# Patient Record
Sex: Male | Born: 1995 | Race: Black or African American | Hispanic: No | Marital: Single | Smoking: Never smoker
Health system: Southern US, Community
[De-identification: ages and names within clinical notes are randomized; demographics above are authoritative.]

## PROBLEM LIST (undated history)

## (undated) DIAGNOSIS — I319 Disease of pericardium, unspecified: Secondary | ICD-10-CM

## (undated) DIAGNOSIS — R011 Cardiac murmur, unspecified: Secondary | ICD-10-CM

## (undated) DIAGNOSIS — J40 Bronchitis, not specified as acute or chronic: Secondary | ICD-10-CM

---
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
## 2015-03-31 ENCOUNTER — Emergency Department (INDEPENDENT_AMBULATORY_CARE_PROVIDER_SITE_OTHER)
Admission: EM | Admit: 2015-03-31 | Discharge: 2015-03-31 | Disposition: A | Discharge status: Home / Self Care | Payer: Federal, State, Local not specified - PPO | Source: Home / Self Care | Attending: Family Medicine | Admitting: Family Medicine

## 2015-03-31 ENCOUNTER — Encounter (HOSPITAL_COMMUNITY): Payer: Self-pay | Admitting: Emergency Medicine

## 2015-03-31 DIAGNOSIS — J301 Allergic rhinitis due to pollen: Secondary | ICD-10-CM | POA: Diagnosis not present

## 2015-03-31 MED ORDER — TRIAMCINOLONE ACETONIDE 40 MG/ML IJ SUSP
40.0000 mg | Freq: Once | INTRAMUSCULAR | Status: AC
  Administered 2015-03-31: 40 mg via INTRAMUSCULAR

## 2015-03-31 MED ORDER — TRIAMCINOLONE ACETONIDE 40 MG/ML IJ SUSP
INTRAMUSCULAR | Status: AC
  Filled 2015-03-31: qty 1

## 2015-03-31 NOTE — ED Notes (Signed)
C/o allergies since early march.  Sx include sneezing, itchy eyes  Reports he was seen here last year and rec'd an IM inj that helped Alert, no signs of acute distress.

## 2015-03-31 NOTE — Discharge Instructions (Signed)
Allergic Rhinitis Allergic rhinitis is when the mucous membranes in the nose respond to allergens. Allergens are particles in the air that cause your body to have an allergic reaction. This causes you to release allergic antibodies. Through a chain of events, these eventually cause you to release histamine into the blood stream. Although meant to protect the body, it is this release of histamine that causes your discomfort, such as frequent sneezing, congestion, and an itchy, runny nose.  CAUSES  Seasonal allergic rhinitis (hay fever) is caused by pollen allergens that may come from grasses, trees, and weeds. Year-round allergic rhinitis (perennial allergic rhinitis) is caused by allergens such as house dust mites, pet dander, and mold spores.  SYMPTOMS   Nasal stuffiness (congestion).  Itchy, runny nose with sneezing and tearing of the eyes. DIAGNOSIS  Your health care provider can help you determine the allergen or allergens that trigger your symptoms. If you and your health care provider are unable to determine the allergen, skin or blood testing may be used. TREATMENT  Allergic rhinitis does not have a cure, but it can be controlled by:   May different medications are available over the counter. It is best to continue taking these to keep symptoms at bay. Many of these medicines are the same prescription medications prescribed just 2 or more years ago and available over the counter.  Medicines and allergy shots (immunotherapy).  Avoiding the allergen. Hay fever may often be treated with antihistamines in pill or nasal spray forms. Antihistamines block the effects of histamine. There are over-the-counter medicines that may help with nasal congestion and swelling around the eyes. Check with your health care provider before taking or giving this medicine.  If avoiding the allergen or the medicine prescribed do not work, there are many new medicines your health care provider can prescribe. Stronger  medicine may be used if initial measures are ineffective. Desensitizing injections can be used if medicine and avoidance does not work. Desensitization is when a patient is given ongoing shots until the body becomes less sensitive to the allergen. Make sure you follow up with your health care provider if problems continue. HOME CARE INSTRUCTIONS It is not possible to completely avoid allergens, but you can reduce your symptoms by taking steps to limit your exposure to them. It helps to know exactly what you are allergic to so that you can avoid your specific triggers. SEEK MEDICAL CARE IF:   You have a fever.  You develop a cough that does not stop easily (persistent).  You have shortness of breath.  You start wheezing.  Symptoms interfere with normal daily activities. Document Released: 07/30/2001 Document Revised: 11/09/2013 Document Reviewed: 07/12/2013 [facility] Patient Information 2015 [place], [place]. This information is not intended to replace advice given to you by your health care provider. Make sure you discuss any questions you have with your health care provider.

## 2015-03-31 NOTE — ED Provider Notes (Signed)
CSN: [id number]     Arrival date & time 03/31/15  1519 History   First MD Initiated Contact with Patient 03/31/15 1548     Chief Complaint  Patient presents with  . Allergies   (Consider location/radiation/quality/duration/timing/severity/associated sxs/prior Treatment) HPI Comments: 19 year old male complaining of allergic rhinitis symptoms since policies and started in March of this year. Complains of PND, sneezing, watery eyes, occasional cough and runny nose. He states that most medicines over-the-counter do not really help. He wants an injection of Kenalog.   Past Medical History  Diagnosis Date  . Heart murmur   . Bronchitis    History reviewed. No pertinent past surgical history. Family History  Problem Relation Age of Onset  . Sudden death Neg Hx   . Heart attack Neg Hx    History  Substance Use Topics  . Smoking status: Never Smoker   . Smokeless tobacco: Not on file  . Alcohol Use: No    Review of Systems  Constitutional: Negative for fever, activity change and fatigue.  HENT: Negative for sore throat.        As per history of present illness  Eyes: Positive for itching.  Respiratory: Negative for shortness of breath.   Cardiovascular: Negative.   All other systems reviewed and are negative.   Allergies  Dust mite extract  Home Medications   Prior to Admission medications   Medication Sig Start Date End Date Taking? Authorizing Provider  cetirizine (ZYRTEC) 10 MG tablet Take 10 mg by mouth daily.    Historical Provider, MD  cetirizine (ZYRTEC) 10 MG tablet Take 1 tablet (10 mg total) by mouth daily. One tab daily for allergies 03/22/14   Linna HoffJames D Kindl, MD  chlorpheniramine-HYDROcodone [facility](TUSSIONEX PENNKINETIC ER) 10-8 MG/5ML [facility] Take 5 mLs by mouth every 12 (twelve) hours as needed for cough. 03/22/14   Linna HoffJames D Kindl, MD  diclofenac sodium (VOLTAREN) 1 % GEL Apply topically. As needed bilateral knees    Historical Provider, MD  doxycycline (VIBRAMYCIN) 100 MG  capsule Take 1 capsule (100 mg total) by mouth 2 (two) times daily. One po bid x 7 days 08/11/13   Geoffery Lyonsouglas Delo, MD  fluticasone [facility](FLONASE) 50 MCG/ACT nasal spray Place 1 spray into both nostrils 2 (two) times daily. 03/22/14   Linna HoffJames D Kindl, MD  Guaifenesin 1200 MG TB12 Take 1 tablet (1,200 mg total) by mouth 2 (two) times daily. 01/24/13   Charlestine Nighthristopher Lawyer, PA-C  ibuprofen (ADVIL,MOTRIN) 800 MG tablet Take 1 tablet (800 mg total) by mouth every 8 (eight) hours as needed for pain. 01/24/13   Christopher Lawyer, PA-C   BP 136/73 mmHg  Pulse 75  Temp(Src) 98.2 F (36.8 C) (Oral)  Resp 16  SpO2 98% Physical Exam  Constitutional: He is oriented to person, place, and time. He appears well-developed and well-nourished. No distress.  HENT:  Mouth/Throat: Oropharynx is clear and moist. No oropharyngeal exudate.  Bilateral TMs are normal  Eyes: Conjunctivae and EOM are normal.  Neck: Normal range of motion. Neck supple.  Cardiovascular: Normal rate, regular rhythm and normal heart sounds.   Pulmonary/Chest: Effort normal and breath sounds normal. No respiratory distress.  Musculoskeletal: He exhibits no edema.  Lymphadenopathy:    He has no cervical adenopathy.  Neurological: He is alert and oriented to person, place, and time. He exhibits normal muscle tone.  Skin: Skin is warm and dry. No rash noted.  Nursing note and vitals reviewed.   ED Course  Procedures (including critical care time) Labs  Review Labs Reviewed - No data to display  Imaging Review No results found.   MDM   1. Allergic rhinitis due to pollen    Kenalog 40 mg IM now OTC meds are available, instructions provided.    Hayden Rasmussenavid Sameeha Rockefeller, NP 03/31/15 [phone]

## 2015-05-21 ENCOUNTER — Encounter (HOSPITAL_BASED_OUTPATIENT_CLINIC_OR_DEPARTMENT_OTHER): Payer: Self-pay | Admitting: *Deleted

## 2015-05-21 ENCOUNTER — Emergency Department (HOSPITAL_BASED_OUTPATIENT_CLINIC_OR_DEPARTMENT_OTHER)
Admission: EM | Admit: 2015-05-21 | Discharge: 2015-05-21 | Disposition: A | Discharge status: Home / Self Care | Payer: Federal, State, Local not specified - PPO | Attending: Emergency Medicine | Admitting: Emergency Medicine

## 2015-05-21 DIAGNOSIS — Z7951 Long term (current) use of inhaled steroids: Secondary | ICD-10-CM | POA: Diagnosis not present

## 2015-05-21 DIAGNOSIS — S50861A Insect bite (nonvenomous) of right forearm, initial encounter: Secondary | ICD-10-CM | POA: Diagnosis present

## 2015-05-21 DIAGNOSIS — W57XXXA Bitten or stung by nonvenomous insect and other nonvenomous arthropods, initial encounter: Secondary | ICD-10-CM | POA: Insufficient documentation

## 2015-05-21 DIAGNOSIS — Y998 Other external cause status: Secondary | ICD-10-CM | POA: Diagnosis not present

## 2015-05-21 DIAGNOSIS — Y9389 Activity, other specified: Secondary | ICD-10-CM | POA: Insufficient documentation

## 2015-05-21 DIAGNOSIS — Z8709 Personal history of other diseases of the respiratory system: Secondary | ICD-10-CM | POA: Diagnosis not present

## 2015-05-21 DIAGNOSIS — Y9289 Other specified places as the place of occurrence of the external cause: Secondary | ICD-10-CM | POA: Diagnosis not present

## 2015-05-21 DIAGNOSIS — R011 Cardiac murmur, unspecified: Secondary | ICD-10-CM | POA: Diagnosis not present

## 2015-05-21 DIAGNOSIS — Z79899 Other long term (current) drug therapy: Secondary | ICD-10-CM | POA: Diagnosis not present

## 2015-05-21 NOTE — ED Notes (Addendum)
Here for evaluation of possible maggot exposure. Saw maggots in his shoes. Threw shoes out and washed feet, then poured alcohol all over his toes. Mentions L little toe was hurting, but has resolved. Also mentions some itching bilateral FAs and L ankle and bilateral feet. No rash or hives noted. Some redness. States, "have been scratching". Skin intact. No meds PTA. Scant redness noted to L little toe upon removing socks and shoes.

## 2015-05-21 NOTE — ED Provider Notes (Signed)
CSN: [id number]     Arrival date & time 05/21/15  0119 History   First MD Initiated Contact with Patient 05/21/15 0150     Chief Complaint  Patient presents with  . Insect Bite     (Consider location/radiation/quality/duration/timing/severity/associated sxs/prior Treatment) Patient is a 19 y.o. male presenting with rash. The history is provided by the patient.  Rash Location:  Shoulder/arm Shoulder/arm rash location:  R forearm Quality: not painful, not red and not swelling   Severity:  Mild Onset quality:  Sudden Timing:  Constant Progression:  Resolved Chronicity:  New Context: not animal contact   Relieved by:  Nothing Worsened by:  Nothing tried Ineffective treatments:  None tried Associated symptoms: no abdominal pain, no fever and no hoarse voice   Saw maggots in his shoes today and had a lesion on forearm and thought it was related but lesion is now gone.    Past Medical History  Diagnosis Date  . Heart murmur   . Bronchitis    History reviewed. No pertinent past surgical history. Family History  Problem Relation Age of Onset  . Sudden death Neg Hx   . Heart attack Neg Hx    History  Substance Use Topics  . Smoking status: Never Smoker   . Smokeless tobacco: Not on file  . Alcohol Use: No    Review of Systems  Constitutional: Negative for fever.  HENT: Negative for drooling, ear discharge and hoarse voice.   Gastrointestinal: Negative for abdominal pain.  Skin: Positive for rash.  All other systems reviewed and are negative.     Allergies  Dust mite extract  Home Medications   Prior to Admission medications   Medication Sig Start Date End Date Taking? Authorizing Provider  cetirizine (ZYRTEC) 10 MG tablet Take 10 mg by mouth daily.    Historical Provider, MD  cetirizine (ZYRTEC) 10 MG tablet Take 1 tablet (10 mg total) by mouth daily. One tab daily for allergies 03/22/14   Linna HoffJames D Kindl, MD  chlorpheniramine-HYDROcodone [facility](TUSSIONEX PENNKINETIC ER)  10-8 MG/5ML [facility] Take 5 mLs by mouth every 12 (twelve) hours as needed for cough. 03/22/14   Linna HoffJames D Kindl, MD  diclofenac sodium (VOLTAREN) 1 % GEL Apply topically. As needed bilateral knees    Historical Provider, MD  doxycycline (VIBRAMYCIN) 100 MG capsule Take 1 capsule (100 mg total) by mouth 2 (two) times daily. One po bid x 7 days 08/11/13   Geoffery Lyonsouglas Delo, MD  fluticasone [facility](FLONASE) 50 MCG/ACT nasal spray Place 1 spray into both nostrils 2 (two) times daily. 03/22/14   Linna HoffJames D Kindl, MD  Guaifenesin 1200 MG TB12 Take 1 tablet (1,200 mg total) by mouth 2 (two) times daily. 01/24/13   Charlestine Nighthristopher Lawyer, PA-C  ibuprofen (ADVIL,MOTRIN) 800 MG tablet Take 1 tablet (800 mg total) by mouth every 8 (eight) hours as needed for pain. 01/24/13   Christopher Lawyer, PA-C   BP 136/77 mmHg  Pulse 95  Temp(Src) 98.1 F (36.7 C) (Oral)  Resp 20  Ht 5\' 9"  (1.753 m)  Wt 107 lb (48.535 kg)  BMI 15.79 kg/m2  SpO2 98% Physical Exam  Constitutional: He is oriented to person, place, and time. He appears well-developed and well-nourished. No distress.  HENT:  Head: Normocephalic and atraumatic.  Mouth/Throat: Oropharynx is clear and moist.  Eyes: Conjunctivae are normal. Pupils are equal, round, and reactive to light.  Neck: Normal range of motion. Neck supple.  Cardiovascular: Normal rate, regular rhythm and intact distal pulses.  Pulmonary/Chest: Effort normal and breath sounds normal. He has no wheezes. He has no rales.  Abdominal: Soft. Bowel sounds are normal. There is no tenderness. There is no rebound and no guarding.  Musculoskeletal: Normal range of motion.  Neurological: He is alert and oriented to person, place, and time.  Skin: Skin is warm and dry. No rash noted.  Psychiatric: He has a normal mood and affect.    ED Course  Procedures (including critical care time) Labs Review Labs Reviewed - No data to display  Imaging Review No results found.   EKG Interpretation None      MDM    Final diagnoses:  Insect bite    Have house exterminated.  No lesions on the skin.      Cy Blamer, MD 05/21/15 [phone]

## 2015-07-10 ENCOUNTER — Emergency Department (INDEPENDENT_AMBULATORY_CARE_PROVIDER_SITE_OTHER)
Admission: EM | Admit: 2015-07-10 | Discharge: 2015-07-10 | Disposition: A | Discharge status: Home / Self Care | Payer: Federal, State, Local not specified - PPO | Source: Home / Self Care

## 2015-07-10 ENCOUNTER — Encounter (HOSPITAL_COMMUNITY): Payer: Self-pay | Admitting: Emergency Medicine

## 2015-07-10 DIAGNOSIS — R51 Headache: Secondary | ICD-10-CM | POA: Diagnosis not present

## 2015-07-10 DIAGNOSIS — R519 Headache, unspecified: Secondary | ICD-10-CM

## 2015-07-10 LAB — POCT RAPID STREP A: Streptococcus, Group A Screen (Direct): NEGATIVE

## 2015-07-10 MED ORDER — IBUPROFEN 600 MG PO TABS: 600.0000 mg | ORAL_TABLET | Freq: Four times a day (QID) | ORAL | Status: DC | PRN

## 2015-07-10 NOTE — Discharge Instructions (Signed)
The cause of your symptoms is not immediately clear but may be due to a mild viral infection, strep infection, or from stress and fatigue. Please get plenty of rest and stay well-hydrated with fluids like Gatorade or Powerade. Please use the Motrin 600 for additional headache and sore throat relief. We will call you if your second strep test is positive to get you antibiotics. The emergency room if you get worse.

## 2015-07-10 NOTE — ED Provider Notes (Signed)
CSN: [id number]     Arrival date & time 07/10/15  1957 History   None    Chief Complaint  Patient presents with  . Headache  . Sore Throat   (Consider location/radiation/quality/duration/timing/severity/associated sxs/prior Treatment) HPI  HA and and mild sore throat. Started 9 days ago. Sore throat is improving but HA is persistent vut improving as well. Tylenol and motrin 200 x 2 w/ some improvement. Diarrhea for a couple of days but has resolved. No caffeine use. HA all over. Denies nausea, vomiting, LOC, change in cognition, chest pain, shortness breath, palpations, neck stiffness, fevers, rash, runny nose, cough, congestion. Patient states that he works at [employer] and was asked to wash the dishes the night prior to onset of symptoms. States that his socks got what was concerned that his symptoms may have come from that.    Past Medical History  Diagnosis Date  . Heart murmur   . Bronchitis    History reviewed. No pertinent past surgical history. Family History  Problem Relation Age of Onset  . Sudden death Neg Hx   . Heart attack Neg Hx    Social History  Substance Use Topics  . Smoking status: Never Smoker   . Smokeless tobacco: None  . Alcohol Use: No    Review of Systems Per HPI with all other pertinent systems negative.   Allergies  Dust mite extract  Home Medications   Prior to Admission medications   Medication Sig Start Date End Date Taking? Authorizing Provider  cetirizine (ZYRTEC) 10 MG tablet Take 10 mg by mouth daily.    Historical Provider, MD  diclofenac sodium (VOLTAREN) 1 % GEL Apply topically. As needed bilateral knees    Historical Provider, MD  fluticasone (FLONASE) 50 MCG/ACT nasal spray Place 1 spray into both nostrils 2 (two) times daily. 03/22/14   Linna Hoff, MD  Guaifenesin 1200 MG TB12 Take 1 tablet (1,200 mg total) by mouth 2 (two) times daily. 01/24/13   Charlestine Night, PA-C  ibuprofen (ADVIL,MOTRIN) 600 MG tablet Take 1 tablet (600 mg  total) by mouth every 6 (six) hours as needed. 07/10/15   Ozella Rocks, MD   BP 130/86 mmHg  Pulse 87  Temp(Src) 97.9 F (36.6 C) (Oral)  Resp 14  SpO2 97% Physical Exam Physical Exam  Constitutional: oriented to person, place, and time. appears well-developed and well-nourished. No distress.  HENT:  Head: Normocephalic and atraumatic.  Eyes: EOMI. PERRL.  Neck: Normal range of motion.  Cardiovascular: RRR, no m/r/g, 2+ distal pulses,  Pulmonary/Chest: Effort normal and breath sounds normal. No respiratory distress.  Abdominal: Soft. Bowel sounds are normal. NonTTP, no distension.  Musculoskeletal: Normal range of motion. Non ttp, no effusion.  Neurological: alert and oriented to person, place, and time.  Skin: Skin is warm. No rash noted. non diaphoretic.  Psychiatric: normal mood and affect. behavior is normal. Judgment and thought content normal.   ED Course  Procedures (including critical care time) Labs Review Labs Reviewed  POCT RAPID STREP A    Imaging Review No results found.   MDM   1. Nonintractable headache    Strep negative. We'll send strep culture. Start Motrin 600, fluids, rest. Go to ED if gets worse.    Ozella Rocks, MD 07/10/15 2113

## 2015-07-10 NOTE — ED Notes (Signed)
Headache, sore throat, cough, nausea and diarrhea started on Saturday 8/13.  Reports diarrhea has stopped, but concerned for the remainder of symptoms.  Denies any vomiting or fever.

## 2015-07-13 LAB — CULTURE, GROUP A STREP: Strep A Culture: NEGATIVE

## 2015-08-22 ENCOUNTER — Encounter (HOSPITAL_COMMUNITY): Payer: Self-pay | Admitting: Emergency Medicine

## 2015-08-22 ENCOUNTER — Emergency Department (INDEPENDENT_AMBULATORY_CARE_PROVIDER_SITE_OTHER)
Admission: EM | Admit: 2015-08-22 | Discharge: 2015-08-22 | Disposition: A | Discharge status: Home / Self Care | Payer: Federal, State, Local not specified - PPO | Source: Home / Self Care | Attending: Family Medicine | Admitting: Family Medicine

## 2015-08-22 DIAGNOSIS — H109 Unspecified conjunctivitis: Secondary | ICD-10-CM | POA: Diagnosis not present

## 2015-08-22 MED ORDER — ERYTHROMYCIN 5 MG/GM OP OINT: TOPICAL_OINTMENT | Freq: Once | OPHTHALMIC | Status: DC

## 2015-08-22 NOTE — ED Provider Notes (Addendum)
CSN: [id number]     Arrival date & time 08/22/15  1311 History   First MD Initiated Contact with Patient 08/22/15 1358     Chief Complaint  Patient presents with  . Eye Problem   (Consider location/radiation/quality/duration/timing/severity/associated sxs/prior Treatment) Patient is a 19 y.o. male presenting with eye problem. The history is provided by the patient. No language interpreter was used.  Eye Problem Location:  L eye Quality:  Tearing Associated symptoms: discharge and itching   Associated symptoms: no photophobia and no redness   Patient presents with 2 day history of itching LEFT eye, discharge from nasal canthus.  No fevers or chills, but has had scratchy throat for the past 2 days. Was in ED accompanying a friend a few days ago, otherwise no sick contacts. 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   Denies photophobia. Feels like prior episode of pink eye.   Past Medical History  Diagnosis Date  . Heart murmur   . Bronchitis    History reviewed. No pertinent past surgical history. Family History  Problem Relation Age of Onset  . Sudden death Neg Hx   . Heart attack Neg Hx    Social History  Substance Use Topics  . Smoking status: Never Smoker   . Smokeless tobacco: None  . Alcohol Use: No    Review of Systems  Constitutional: Negative for fever, chills, diaphoresis, appetite change and fatigue.  HENT: Positive for sore throat. Negative for congestion, ear discharge, ear pain, facial swelling, nosebleeds, postnasal drip, rhinorrhea, sinus pressure and trouble swallowing.   Eyes: Positive for discharge and itching. Negative for photophobia, pain, redness and visual disturbance.  Respiratory: Negative for cough.     Allergies  Dust mite extract  Home Medications   Prior to Admission medications   Medication Sig Start Date End Date Taking? Authorizing Provider  cetirizine (ZYRTEC) 10 MG tablet Take 10 mg by mouth daily.    Historical Provider, MD  diclofenac sodium  (VOLTAREN) 1 % GEL Apply topically. As needed bilateral knees    Historical Provider, MD  fluticasone (FLONASE) 50 MCG/ACT nasal spray Place 1 spray into both nostrils 2 (two) times daily. 03/22/14   Linna Hoff, MD  Guaifenesin 1200 MG TB12 Take 1 tablet (1,200 mg total) by mouth 2 (two) times daily. 01/24/13   Charlestine Night, PA-C  ibuprofen (ADVIL,MOTRIN) 600 MG tablet Take 1 tablet (600 mg total) by mouth every 6 (six) hours as needed. 07/10/15   Ozella Rocks, MD   Meds Ordered and Administered this Visit  Medications - No data to display  BP 121/76 mmHg  Pulse 68  Temp(Src) 97.9 F (36.6 C) (Oral)  Resp 14  SpO2 100% No data found.   Physical Exam  Constitutional: He appears well-developed and well-nourished.  HENT:  Head: Normocephalic and atraumatic.  Right Ear: External ear normal.  Left Ear: External ear normal.  Mouth/Throat: No oropharyngeal exudate.  Mild irritation and cobblestoning of oropharynx, without exudate  Eyes: Conjunctivae and EOM are normal. No scleral icterus.  LEFT eye with non-purulent discharge. PERRL. EOMI. Erythema of conjunctiva of LEFT eye.   Neck: Normal range of motion. Neck supple. No thyromegaly present.  Lymphadenopathy:    He has no cervical adenopathy.    ED Course  Procedures (including critical care time)  Labs Review Labs Reviewed - No data to display  Imaging Review No results found.   Visual Acuity Review  Right Eye Distance:   Left Eye Distance:   Bilateral Distance:  Right Eye Near:   Left Eye Near:    Bilateral Near:         MDM  No diagnosis found.  ADDENDUM TO ORIGINAL NOTE:  Patient returned to [facility] after visit asking for Rx for antibiotic ointment for eye.  He states that pharmacist agrees he needs this. I have explained to him extensively during the visit why this is unnecessary to treat a presumed viral conjunctivitis.  Prescription given to placate patient,I believe it will be relatively innocuous  for him to use x1.   Barbaraann Barthel, MD 08/22/15 1412  Barbaraann Barthel, MD 08/22/15 1416  Barbaraann Barthel, MD 08/22/15 [phone]

## 2015-08-22 NOTE — ED Notes (Signed)
Left eye pink, noticed drainage and pain that started yesterday.  Does not wear contacts.

## 2015-08-22 NOTE — Discharge Instructions (Signed)
It was a pleasure to see you today.  The pink eye of your left eye is most likely viral, and it will improve with time.   Maintain very careful hygiene (hand washing, face washing) and avoidance of spreading to others.  It may spread to your right eye; if so, use warm compresses on both eyes.   Note out of work, may return on Friday, October 7th.   Warm compresses to the left eye frequently throughout the day.

## 2015-10-10 ENCOUNTER — Encounter (HOSPITAL_COMMUNITY): Payer: Self-pay | Admitting: *Deleted

## 2015-10-10 ENCOUNTER — Other Ambulatory Visit (HOSPITAL_COMMUNITY)
Admission: RE | Admit: 2015-10-10 | Discharge: 2015-10-10 | Disposition: A | Discharge status: Home / Self Care | Payer: Federal, State, Local not specified - PPO | Source: Ambulatory Visit | Attending: Emergency Medicine | Admitting: Emergency Medicine

## 2015-10-10 ENCOUNTER — Emergency Department (INDEPENDENT_AMBULATORY_CARE_PROVIDER_SITE_OTHER)
Admission: EM | Admit: 2015-10-10 | Discharge: 2015-10-10 | Disposition: A | Discharge status: Home / Self Care | Payer: Federal, State, Local not specified - PPO | Source: Home / Self Care | Attending: Emergency Medicine | Admitting: Emergency Medicine

## 2015-10-10 DIAGNOSIS — Z113 Encounter for screening for infections with a predominantly sexual mode of transmission: Secondary | ICD-10-CM

## 2015-10-10 DIAGNOSIS — J029 Acute pharyngitis, unspecified: Secondary | ICD-10-CM | POA: Diagnosis not present

## 2015-10-10 NOTE — Discharge Instructions (Signed)
We have sent swabs for testing. We will call you if anything is positive. You can use Chloraseptic spray as needed for your sore throat. Salt water gargles can also be beneficial. Your sore throat is likely from a virus or smoke irritants. Follow-up as needed.

## 2015-10-10 NOTE — ED Notes (Signed)
Pt  Has  An irritated  l  Eye          As   Well      sorethroat   And  He  Wants  To  Be  Checked  For  An  Std     -  He  denys  Any          Penile   Discharge

## 2015-10-10 NOTE — ED Provider Notes (Signed)
CSN: [id number]     Arrival date & time 10/10/15  1957 History   First MD Initiated Contact with Patient 10/10/15 2010     Chief Complaint  Patient presents with  . Eye Problem   (Consider location/radiation/quality/duration/timing/severity/associated sxs/prior Treatment) HPI He is a 19 year old man here for evaluation of sore throat and STD testing. He states he started to feel a little sick one month ago. At that time he was seen here for left eye conjunctivitis. He states the drops given here did not help, but he saw his eye doctor and was prescribed Tobrex which is helping. He describes a sore throat. No nasal congestion or rhinorrhea. No fevers or chills. No nausea or vomiting. He states he has neighbors to smoke marijuana and other substances. He denies smoking anything, but states the smoked as come into his apartment. He would also like STD screening. He denies any symptoms of penile discharge or dysuria. He is anxious as he has just started sexual activity with his girlfriend.  Past Medical History  Diagnosis Date  . Heart murmur   . Bronchitis    History reviewed. No pertinent past surgical history. Family History  Problem Relation Age of Onset  . Sudden death Neg Hx   . Heart attack Neg Hx    Social History  Substance Use Topics  . Smoking status: Never Smoker   . Smokeless tobacco: None  . Alcohol Use: No    Review of Systems As in history of present illness Allergies  Dust mite extract  Home Medications   Prior to Admission medications   Medication Sig Start Date End Date Taking? Authorizing Provider  cetirizine (ZYRTEC) 10 MG tablet Take 10 mg by mouth daily.    Historical Provider, MD  diclofenac sodium (VOLTAREN) 1 % GEL Apply topically. As needed bilateral knees    Historical Provider, MD  erythromycin ophthalmic ointment Place into the left eye once. Place a 1/2 inch ribbon of ointment into the lower eyelid. 08/22/15   Barbaraann BarthelJames O Breen, MD  fluticasone  (FLONASE) 50 MCG/ACT nasal spray Place 1 spray into both nostrils 2 (two) times daily. 03/22/14   Linna HoffJames D Kindl, MD  Guaifenesin 1200 MG TB12 Take 1 tablet (1,200 mg total) by mouth 2 (two) times daily. 01/24/13   Charlestine Nighthristopher Lawyer, PA-C  ibuprofen (ADVIL,MOTRIN) 600 MG tablet Take 1 tablet (600 mg total) by mouth every 6 (six) hours as needed. 07/10/15   Ozella Rocksavid J Merrell, MD   Meds Ordered and Administered this Visit  Medications - No data to display  BP 130/72 mmHg  Pulse 78  Temp(Src) 98.6 F (37 C) (Oral)  Resp 18  SpO2 99% No data found.   Physical Exam  Constitutional: He is oriented to person, place, and time. He appears well-developed and well-nourished. No distress.  Somewhat anxious  HENT:  Mouth/Throat: No oropharyngeal exudate.  Mild pharyngeal erythema  Eyes:  Left conjunctiva is very minimally injected.  Neck: Neck supple.  Cardiovascular: Normal rate.   Pulmonary/Chest: Effort normal.  Lymphadenopathy:    He has no cervical adenopathy.  Neurological: He is alert and oriented to person, place, and time.    ED Course  Procedures (including critical care time)  Labs Review Labs Reviewed  HIV ANTIBODY (ROUTINE TESTING)  RPR  URINE CYTOLOGY ANCILLARY ONLY  CYTOLOGY, (ORAL, ANAL, URETHRAL) ANCILLARY ONLY    Imaging Review No results found.    MDM   1. Pharyngitis   2. Screen for STD (sexually transmitted disease)  Pharyngitis is likely viral versus contact irritant. Recommended symptomatic treatment. STD testing is collected today. I also collected an oral swab for gonorrhea and chlamydia.    Charm Rings, MD 10/10/15 2041

## 2015-10-11 LAB — RPR: RPR Ser Ql: NONREACTIVE

## 2015-10-11 LAB — HIV ANTIBODY (ROUTINE TESTING W REFLEX): HIV Screen 4th Generation wRfx: NONREACTIVE

## 2015-10-12 LAB — CYTOLOGY, (ORAL, ANAL, URETHRAL) ANCILLARY ONLY
CHLAMYDIA, DNA PROBE: POSITIVE — AB
NEISSERIA GONORRHEA: NEGATIVE

## 2015-10-13 LAB — URINE CYTOLOGY ANCILLARY ONLY
CHLAMYDIA, DNA PROBE: POSITIVE — AB
Neisseria Gonorrhea: NEGATIVE
Trichomonas: NEGATIVE

## 2015-10-13 NOTE — ED Notes (Addendum)
Final report of STD testing available for review. Positive for chlamydia. [facility] Called patient, and after verifying ID, discussed lab report findings. [facility] [address]. Spoke directly w pharmacy staff. Form 2124 DHHS [facility]. Patient asked we call the Rx to [facility] [address], [place] . Called and relayed new Rx to pharmacy staff

## 2015-10-15 ENCOUNTER — Emergency Department (INDEPENDENT_AMBULATORY_CARE_PROVIDER_SITE_OTHER)
Admission: EM | Admit: 2015-10-15 | Discharge: 2015-10-15 | Disposition: A | Discharge status: Home / Self Care | Payer: Federal, State, Local not specified - PPO | Source: Home / Self Care | Attending: Family Medicine | Admitting: Family Medicine

## 2015-10-15 ENCOUNTER — Encounter (HOSPITAL_COMMUNITY): Payer: Self-pay | Admitting: *Deleted

## 2015-10-15 DIAGNOSIS — A740 Chlamydial conjunctivitis: Secondary | ICD-10-CM | POA: Diagnosis not present

## 2015-10-15 MED ORDER — ERYTHROMYCIN 5 MG/GM OP OINT: TOPICAL_OINTMENT | Freq: Three times a day (TID) | OPHTHALMIC | Status: AC

## 2015-10-15 NOTE — ED Notes (Signed)
Visual  Acuity  20/   15   r   20/20  Left

## 2015-10-15 NOTE — ED Notes (Signed)
Pt  Reports       l  Eye  Is red  Irritated  And  Some  Blurred  Vision      Pt   Seen  Recently  For  chlymydia       He  Reports  Seeing his  Eye  Dr  Recently

## 2015-10-15 NOTE — ED Provider Notes (Signed)
CSN: [id number]     Arrival date & time 10/15/15  1520 History   First MD Initiated Contact with Patient 10/15/15 1555     Chief Complaint  Patient presents with  . Conjunctivitis   (Consider location/radiation/quality/duration/timing/severity/associated sxs/prior Treatment) Patient is a 19 y.o. male presenting with conjunctivitis. The history is provided by the patient.  Conjunctivitis This is a recurrent problem. The current episode started more than 2 days ago. The problem has been gradually improving (dx'd with chlamydia and given meds but not taking as directed.).    Past Medical History  Diagnosis Date  . Heart murmur   . Bronchitis    History reviewed. No pertinent past surgical history. Family History  Problem Relation Age of Onset  . Sudden death Neg Hx   . Heart attack Neg Hx    Social History  Substance Use Topics  . Smoking status: Never Smoker   . Smokeless tobacco: None  . Alcohol Use: No    Review of Systems  Constitutional: Negative.   HENT: Negative.   Eyes: Positive for redness and visual disturbance. Negative for photophobia and pain.  Respiratory: Negative.   Hematological: Negative for adenopathy.  All other systems reviewed and are negative.   Allergies  Dust mite extract  Home Medications   Prior to Admission medications   Medication Sig Start Date End Date Taking? Authorizing Provider  cetirizine (ZYRTEC) 10 MG tablet Take 10 mg by mouth daily.    Historical Provider, MD  diclofenac sodium (VOLTAREN) 1 % GEL Apply topically. As needed bilateral knees    Historical Provider, MD  erythromycin ophthalmic ointment Place into the left eye 3 (three) times daily. after warm cloth soak to eye. 10/15/15   Linna Hoff, MD  fluticasone (FLONASE) 50 MCG/ACT nasal spray Place 1 spray into both nostrils 2 (two) times daily. 03/22/14   Linna Hoff, MD  Guaifenesin 1200 MG TB12 Take 1 tablet (1,200 mg total) by mouth 2 (two) times daily. 01/24/13    Charlestine Night, PA-C  ibuprofen (ADVIL,MOTRIN) 600 MG tablet Take 1 tablet (600 mg total) by mouth every 6 (six) hours as needed. 07/10/15   Ozella Rocks, MD   Meds Ordered and Administered this Visit  Medications - No data to display  BP 122/79 mmHg  Pulse 77  Temp(Src) 98.4 F (36.9 C) (Oral)  SpO2 100% No data found.   Physical Exam  Constitutional: He is oriented to person, place, and time. He appears well-developed and well-nourished. No distress.  Eyes: EOM and lids are normal. Pupils are equal, round, and reactive to light. Lids are everted and swept, no foreign bodies found. Left eye exhibits no discharge and no exudate. Right conjunctiva is not injected. Right conjunctiva has no hemorrhage. Left conjunctiva is injected. Left conjunctiva has no hemorrhage. No scleral icterus.  Lymphadenopathy:    He has no cervical adenopathy.  Neurological: He is oriented to person, place, and time.  Skin: Skin is warm and dry.  Nursing note and vitals reviewed.   ED Course  Procedures (including critical care time)  Labs Review Labs Reviewed - No data to display  Imaging Review No results found.   Visual Acuity Review  Right Eye Distance:   Left Eye Distance:   Bilateral Distance:    Right Eye Near:   Left Eye Near:    Bilateral Near:         MDM   1. Conjunctivitis, chlamydia        Fayrene Fearing  Sallyanne Kuster Janeisha Ryle, MD 10/15/15 2006

## 2015-10-19 NOTE — ED Notes (Signed)
Patient returned w mother for discussion of STD treatment and future avoidance .patient gave verbal consent for this discussion.  Parent upset since [place] "[place] "

## 2015-10-24 ENCOUNTER — Other Ambulatory Visit: Payer: Self-pay | Admitting: Family Medicine

## 2015-11-10 NOTE — ED Notes (Signed)
Patient returned w questions related to STD's

## 2015-11-21 ENCOUNTER — Other Ambulatory Visit (HOSPITAL_COMMUNITY)
Admission: RE | Admit: 2015-11-21 | Discharge: 2015-11-21 | Disposition: A | Discharge status: Home / Self Care | Payer: Federal, State, Local not specified - PPO | Source: Ambulatory Visit | Attending: Emergency Medicine | Admitting: Emergency Medicine

## 2015-11-21 ENCOUNTER — Encounter (HOSPITAL_COMMUNITY): Payer: Self-pay | Admitting: Emergency Medicine

## 2015-11-21 ENCOUNTER — Emergency Department (HOSPITAL_COMMUNITY)
Admission: EM | Admit: 2015-11-21 | Discharge: 2015-11-21 | Disposition: A | Discharge status: Home / Self Care | Payer: Federal, State, Local not specified - PPO | Source: Home / Self Care | Attending: Emergency Medicine | Admitting: Emergency Medicine

## 2015-11-21 DIAGNOSIS — Z113 Encounter for screening for infections with a predominantly sexual mode of transmission: Secondary | ICD-10-CM | POA: Diagnosis present

## 2015-11-21 DIAGNOSIS — R300 Dysuria: Secondary | ICD-10-CM

## 2015-11-21 LAB — POCT URINALYSIS DIP (DEVICE)
BILIRUBIN URINE: NEGATIVE
GLUCOSE, UA: NEGATIVE mg/dL
Hgb urine dipstick: NEGATIVE
Ketones, ur: NEGATIVE mg/dL
LEUKOCYTES UA: NEGATIVE
NITRITE: NEGATIVE
Protein, ur: NEGATIVE mg/dL
Specific Gravity, Urine: 1.025 (ref 1.005–1.030)
UROBILINOGEN UA: 1 mg/dL (ref 0.0–1.0)
pH: 7 (ref 5.0–8.0)

## 2015-11-21 NOTE — ED Notes (Signed)
Pt c/o UTI sx onset 12/26 associated w/dysuria, urinary freq/urgency, foul/cloudy urine Also reports he was treated for Chlamydia recently... Wants to know if we can retest him  A&O x4... No acute distress.

## 2015-11-21 NOTE — ED Provider Notes (Addendum)
CSN: [id number]     Arrival date & time 11/21/15  1632 History   First MD Initiated Contact with Patient 11/21/15 1802     Chief Complaint  Patient presents with  . Urinary Tract Infection  . Exposure to STD   (Consider location/radiation/quality/duration/timing/severity/associated sxs/prior Treatment) HPI Comments: 20 year old male is complaining of dysuria, cloudy urine and malodorous urine for the past few days. He was seen in this urgent care in November after having been exposed to a male diagnosed with chlamydia. This patient was called after his chlamydia test came back positive and was treated with azithromycin 1 g by mouth.   Past Medical History  Diagnosis Date  . Heart murmur   . Bronchitis    History reviewed. No pertinent past surgical history. Family History  Problem Relation Age of Onset  . Sudden death Neg Hx   . Heart attack Neg Hx    Social History  Substance Use Topics  . Smoking status: Never Smoker   . Smokeless tobacco: None  . Alcohol Use: No    Review of Systems  Constitutional: Negative.   Respiratory: Negative.   Cardiovascular: Negative.   Genitourinary: Positive for dysuria. Negative for urgency, frequency, flank pain, decreased urine volume, discharge, penile swelling, scrotal swelling, difficulty urinating, genital sores, penile pain and testicular pain.  Neurological: Negative.   All other systems reviewed and are negative.   Allergies  Dust mite extract  Home Medications   Prior to Admission medications   Medication Sig Start Date End Date Taking? Authorizing Provider  cetirizine (ZYRTEC) 10 MG tablet Take 10 mg by mouth daily.    Historical Provider, MD  diclofenac sodium (VOLTAREN) 1 % GEL Apply topically. As needed bilateral knees    Historical Provider, MD  erythromycin ophthalmic ointment Place into the left eye 3 (three) times daily. after warm cloth soak to eye. 10/15/15   Linna Hoff, MD  fluticasone (FLONASE) 50 MCG/ACT  nasal spray Place 1 spray into both nostrils 2 (two) times daily. 03/22/14   Linna Hoff, MD  Guaifenesin 1200 MG TB12 Take 1 tablet (1,200 mg total) by mouth 2 (two) times daily. 01/24/13   Charlestine Night, PA-C  ibuprofen (ADVIL,MOTRIN) 600 MG tablet Take 1 tablet (600 mg total) by mouth every 6 (six) hours as needed. 07/10/15   Ozella Rocks, MD   Meds Ordered and Administered this Visit  Medications - No data to display  BP 126/76 mmHg  Pulse 72  Temp(Src) 99 F (37.2 C) (Oral)  Resp 16  SpO2 99% No data found.   Physical Exam  Constitutional: He is oriented to person, place, and time. He appears well-developed and well-nourished. No distress.  Eyes: EOM are normal.  Neck: Normal range of motion. Neck supple.  Cardiovascular: Normal rate.   Pulmonary/Chest: Effort normal. No respiratory distress.  Musculoskeletal: He exhibits no edema.  Neurological: He is alert and oriented to person, place, and time. He exhibits normal muscle tone.  Skin: Skin is warm and dry.  Psychiatric: He has a normal mood and affect.  Nursing note and vitals reviewed.   ED Course  Procedures (including critical care time)  Labs Review Labs Reviewed  URINE CULTURE  POCT URINALYSIS DIP (DEVICE)  URINE CYTOLOGY ANCILLARY ONLY   Results for orders placed or performed during the hospital encounter of 11/21/15  POCT urinalysis dip (device)  Result Value Ref Range   Glucose, UA NEGATIVE NEGATIVE mg/dL   Bilirubin Urine NEGATIVE NEGATIVE   Ketones,  ur NEGATIVE NEGATIVE mg/dL   Specific Gravity, Urine 1.025 1.005 - 1.030   Hgb urine dipstick NEGATIVE NEGATIVE   pH 7.0 5.0 - 8.0   Protein, ur NEGATIVE NEGATIVE mg/dL   Urobilinogen, UA 1.0 0.0 - 1.0 mg/dL   Nitrite NEGATIVE NEGATIVE   Leukocytes, UA NEGATIVE NEGATIVE     Imaging Review No results found.   Visual Acuity Review  Right Eye Distance:   Left Eye Distance:   Bilateral Distance:    Right Eye Near:   Left Eye Near:     Bilateral Near:         MDM   1. Dysuria    Urine culture and urine cytology pending. For any positives we we will call and treat via the phone. If symptoms persist may return or may go to the health department on Thursday morning for STD testing. Also recommend obtaining a primary care doctor.    Hayden Rasmussenavid Angas Isabell, NP 11/21/15 1841 Michiel CowboyNancy Stack, RN notified the provider Hayden Rasmussenavid Floyce Bujak, NP of the results of the urine. Even though there were less than 10,000 CFU's we will go ahead and treat with Septra DS twice a day for 7 days. Nurse Selina CooleyStack received the order as written on the chart to call or send  to the pharmacy as well as notified the patient to pick up the medication. 11/24/2015.  Hayden Rasmussenavid Roshard Rezabek, NP 11/24/15 2051

## 2015-11-21 NOTE — ED Notes (Signed)
Call back number verified.  

## 2015-11-21 NOTE — Discharge Instructions (Signed)

## 2015-11-22 LAB — URINE CYTOLOGY ANCILLARY ONLY
Chlamydia: NEGATIVE
Neisseria Gonorrhea: NEGATIVE
Trichomonas: NEGATIVE

## 2015-11-24 LAB — URINE CULTURE

## 2015-11-24 NOTE — ED Notes (Signed)
Verified notation

## 2016-02-13 ENCOUNTER — Emergency Department (HOSPITAL_BASED_OUTPATIENT_CLINIC_OR_DEPARTMENT_OTHER)
Admission: EM | Admit: 2016-02-13 | Discharge: 2016-02-13 | Disposition: A | Discharge status: Home / Self Care | Payer: Federal, State, Local not specified - PPO | Attending: Emergency Medicine | Admitting: Emergency Medicine

## 2016-02-13 ENCOUNTER — Encounter (HOSPITAL_BASED_OUTPATIENT_CLINIC_OR_DEPARTMENT_OTHER): Payer: Self-pay | Admitting: *Deleted

## 2016-02-13 ENCOUNTER — Emergency Department (HOSPITAL_BASED_OUTPATIENT_CLINIC_OR_DEPARTMENT_OTHER): Payer: Federal, State, Local not specified - PPO

## 2016-02-13 DIAGNOSIS — Z79899 Other long term (current) drug therapy: Secondary | ICD-10-CM | POA: Diagnosis not present

## 2016-02-13 DIAGNOSIS — R011 Cardiac murmur, unspecified: Secondary | ICD-10-CM | POA: Insufficient documentation

## 2016-02-13 DIAGNOSIS — R202 Paresthesia of skin: Secondary | ICD-10-CM | POA: Diagnosis not present

## 2016-02-13 DIAGNOSIS — Z8709 Personal history of other diseases of the respiratory system: Secondary | ICD-10-CM | POA: Insufficient documentation

## 2016-02-13 DIAGNOSIS — M25531 Pain in right wrist: Secondary | ICD-10-CM | POA: Insufficient documentation

## 2016-02-13 DIAGNOSIS — Z7951 Long term (current) use of inhaled steroids: Secondary | ICD-10-CM | POA: Insufficient documentation

## 2016-02-13 DIAGNOSIS — Z792 Long term (current) use of antibiotics: Secondary | ICD-10-CM | POA: Diagnosis not present

## 2016-02-13 MED ORDER — MELOXICAM 15 MG PO TABS: 15.0000 mg | ORAL_TABLET | Freq: Every day | ORAL | Status: AC

## 2016-02-13 MED ORDER — NAPROXEN 250 MG PO TABS
500.0000 mg | ORAL_TABLET | Freq: Once | ORAL | Status: AC
  Administered 2016-02-13: 500 mg via ORAL
  Filled 2016-02-13: qty 2

## 2016-02-13 NOTE — ED Provider Notes (Addendum)
CSN: [id number]     Arrival date & time 02/13/16  0048 History   First MD Initiated Contact with Patient 02/13/16 0401     Chief Complaint  Patient presents with  . Wrist Pain     (Consider location/radiation/quality/duration/timing/severity/associated sxs/prior Treatment) HPI  This is a 20 year old male with a four-day history of pain in his right forearm. He denies trauma. The pain is located primarily in his wrist but extends proximally to his right medial elbow and distally to his right fourth and fifth fingers. There is also some paresthesias in the right fourth and fifth fingers. He states the pain is "really bad" and worse with movement of the wrist, but not the elbow, and certain positions.  Past Medical History  Diagnosis Date  . Heart murmur   . Bronchitis    History reviewed. No pertinent past surgical history. Family History  Problem Relation Age of Onset  . Sudden death Neg Hx   . Heart attack Neg Hx    Social History  Substance Use Topics  . Smoking status: Never Smoker   . Smokeless tobacco: None  . Alcohol Use: No    Review of Systems  All other systems reviewed and are negative.   Allergies  Dust mite extract  Home Medications   Prior to Admission medications   Medication Sig Start Date End Date Taking? Authorizing Provider  cetirizine (ZYRTEC) 10 MG tablet Take 10 mg by mouth daily.    Historical Provider, MD  erythromycin ophthalmic ointment Place into the left eye 3 (three) times daily. after warm cloth soak to eye. 10/15/15   Linna Hoff, MD  fluticasone (FLONASE) 50 MCG/ACT nasal spray Place 1 spray into both nostrils 2 (two) times daily. 03/22/14   Linna Hoff, MD  Guaifenesin 1200 MG TB12 Take 1 tablet (1,200 mg total) by mouth 2 (two) times daily. 01/24/13   Charlestine Night, PA-C  meloxicam (MOBIC) 15 MG tablet Take 1 tablet (15 mg total) by mouth daily. 02/13/16   Falon Huesca, MD   BP 119/74 mmHg  Pulse 74  Temp(Src) 98.1 F (36.7 C)  (Oral)  Resp 17  Wt 107 lb (48.535 kg)  SpO2 100%   Physical Exam  General: Well-developed, well-nourished male in no acute distress; appearance consistent with age of record HENT: normocephalic; atraumatic Eyes: pupils equal, round and reactive to light; extraocular muscles intact Neck: supple Heart: regular rate and rhythm Lungs: clear to auscultation bilaterally Abdomen: soft; nondistended Extremities: No deformity; full range of motion; pulses normal; tenderness over the right colon are nerve at the elbow, pain on flexion and extension of the right wrist, right hand distally neurovascularly intact with intact tendon function, no tenderness of the tendons of the right wrist Neurologic: Awake, alert and oriented; motor function intact in all extremities and symmetric; no facial droop Skin: Warm and dry Psychiatric: Normal mood and affect    ED Course  Procedures (including critical care time)   MDM  Nursing notes and vitals signs, including pulse oximetry, reviewed.  Summary of this visit's results, reviewed by myself:  Imaging Studies: Dg Wrist Complete Right  02/13/2016  CLINICAL DATA:  20 year old male with right wrist pain. EXAM: RIGHT WRIST - COMPLETE 3+ VIEW COMPARISON:  None. FINDINGS: There is no evidence of fracture or dislocation. There is no evidence of arthropathy or other focal bone abnormality. Soft tissues are unremarkable. IMPRESSION: Negative. Electronically Signed   By: Elgie Collard M.D.   On: 02/13/2016 01:27  Differential diagnosis includes ulnar tunnel syndrome especially given pattern of pain. We will immobilize and treat with a nonsteroidal. We will refer him to sports medicine.  Paula LibraJohn Kylan Liberati, MD 02/13/16 [id number]  Paula LibraJohn Brode Sculley, MD 02/13/16 [phone]

## 2016-02-13 NOTE — ED Notes (Signed)
Pt. Reports pain in the R wrist since Friday.  Pt. Reports [place] [facility] [place] [facility] [employer] and reports some pain with that.   Pt. Does have some swelling noted in the R wrist.

## 2017-06-02 ENCOUNTER — Encounter (HOSPITAL_BASED_OUTPATIENT_CLINIC_OR_DEPARTMENT_OTHER): Payer: Self-pay | Admitting: *Deleted

## 2017-06-02 ENCOUNTER — Emergency Department (HOSPITAL_BASED_OUTPATIENT_CLINIC_OR_DEPARTMENT_OTHER)
Admission: EM | Admit: 2017-06-02 | Discharge: 2017-06-02 | Disposition: A | Discharge status: Home / Self Care | Payer: Federal, State, Local not specified - PPO | Attending: Emergency Medicine | Admitting: Emergency Medicine

## 2017-06-02 DIAGNOSIS — Z79899 Other long term (current) drug therapy: Secondary | ICD-10-CM | POA: Insufficient documentation

## 2017-06-02 DIAGNOSIS — L299 Pruritus, unspecified: Secondary | ICD-10-CM | POA: Insufficient documentation

## 2017-06-02 DIAGNOSIS — W57XXXA Bitten or stung by nonvenomous insect and other nonvenomous arthropods, initial encounter: Secondary | ICD-10-CM | POA: Insufficient documentation

## 2017-06-02 HISTORY — DX: Disease of pericardium, unspecified: I31.9

## 2017-06-02 MED ORDER — DOXYCYCLINE HYCLATE 100 MG PO CAPS: 100.0000 mg | ORAL_CAPSULE | Freq: Two times a day (BID) | ORAL | 0 refills | Status: AC

## 2017-06-02 NOTE — Discharge Instructions (Signed)
Take Doxycycline twice a day for 5 days - avoid the sun and wear sunscreen when outside Take Benadryl for itching

## 2017-06-02 NOTE — ED Notes (Signed)
ED Provider at bedside. 

## 2017-06-02 NOTE — ED Triage Notes (Signed)
Insect bite to his back yesterday. He has been tired. He currently is being treated for pericarditis.

## 2017-06-02 NOTE — ED Provider Notes (Signed)
[facility]   CSN: [id number] Arrival date & time: 06/02/17  1736  By signing my name below, I, Cynda AcresHailei Fulton, attest that this documentation has been prepared under the direction and in the presence of Terance HartKelly Chau Sawin, PA-C.  Electronically Signed: Cynda AcresHailei Fulton, Scribe. 06/02/17. 8:09 PM.  History   Chief Complaint Chief Complaint  Patient presents with  . Insect Bite    HPI Comments: Victor Dennis is a 21 y.o. male with no pertinent past medical history, who presents to the Emergency Department complaining of persistent itching s/p an insect bite to the right-sided back, which he first noticed yesterday. He did not see the bug that bit/stung him. Patient states he was visiting his cousins [place] live [place] a farm recently, the next day he began having gradually worsening itching to his back. According to mother the bite has gradually increased in size. Since then the patient reports associated poor appetite, fatigue, headache, chills, and nausea. Patient reports taking ibuprofen with no relief. Patient denies any fever, vomiting, abdominal pain, rash, or any additional symptoms.   The history is provided by the patient. No language interpreter was used.    Past Medical History:  Diagnosis Date  . Bronchitis   . Heart murmur   . Pericarditis     Patient Active Problem List   Diagnosis Date Noted  . Sports physical 01/13/2013    History reviewed. No pertinent surgical history.     Home Medications    Prior to Admission medications   Medication Sig Start Date End Date Taking? Authorizing Provider  COLCHICINE PO Take by mouth.   Yes [provider]  cetirizine (ZYRTEC) 10 MG tablet Take 10 mg by mouth daily.    [provider]  erythromycin ophthalmic ointment Place into the left eye 3 (three) times daily. after warm cloth soak to eye. 10/15/15   Linna HoffKindl, James D, MD  fluticasone (FLONASE) 50 MCG/ACT nasal spray Place 1 spray into both  nostrils 2 (two) times daily. 03/22/14   Linna HoffKindl, James D, MD  Guaifenesin 1200 MG TB12 Take 1 tablet (1,200 mg total) by mouth 2 (two) times daily. 01/24/13   Lawyer, Cristal Deerhristopher, PA-C  meloxicam (MOBIC) 15 MG tablet Take 1 tablet (15 mg total) by mouth daily. 02/13/16   Molpus, John, MD    Family History Family History  Problem Relation Age of Onset  . Sudden death Neg Hx   . Heart attack Neg Hx     Social History Social History  Substance Use Topics  . Smoking status: Never Smoker  . Smokeless tobacco: Never Used  . Alcohol use No     Allergies   Dust mite extract   Review of Systems Review of Systems  Constitutional: Positive for appetite change, chills and fatigue. Negative for fever.  Gastrointestinal: Positive for nausea. Negative for abdominal pain and vomiting.  Skin: Negative for rash.       Insect bite to the right-sided back.   Neurological: Positive for headaches.     Physical Exam Updated Vital Signs BP 119/75 (BP Location: Right Arm)   Pulse 96   Temp 98.6 F (37 C) (Oral)   Resp 18   Ht 5\' 9"  (1.753 m)   Wt 107 lb (48.5 kg)   SpO2 100%   BMI 15.80 kg/m   Physical Exam  Constitutional: He is oriented to person, place, and time. He appears well-developed and well-nourished. No distress.  HENT:  Head: Normocephalic and atraumatic.  Mouth/Throat: Oropharynx  is clear and moist.  Eyes: Pupils are equal, round, and reactive to light. Conjunctivae and EOM are normal. Right eye exhibits no discharge. Left eye exhibits no discharge. No scleral icterus.  Neck: Normal range of motion. Neck supple.  Cardiovascular: Normal rate and regular rhythm.  Exam reveals no gallop and no friction rub.   No murmur heard. Pulmonary/Chest: Effort normal and breath sounds normal. No respiratory distress. He has no wheezes. He has no rales. He exhibits no tenderness.  Abdominal: Soft. Bowel sounds are normal. He exhibits no distension.  Neurological: He is alert and oriented to  person, place, and time.  Skin: Skin is warm and dry.  Erythematous raised nodule consistent with a bug bite over right thoracic back. There is a patchy, macular, erythematous rash over the upper back  Psychiatric: His behavior is normal. His mood appears anxious.  Nursing note and vitals reviewed.    ED Treatments / Results  DIAGNOSTIC STUDIES: Oxygen Saturation is 100% on RA, normal by my interpretation.    COORDINATION OF CARE: 8:08 PM Discussed treatment plan with pt at bedside and pt agreed to plan, which includes doxycycline.   Labs (all labs ordered are listed, but only abnormal results are displayed) Labs Reviewed - No data to display  EKG  EKG Interpretation None       Radiology No results found.  Procedures Procedures (including critical care time)  Medications Ordered in ED Medications - No data to display   Initial Impression / Assessment and Plan / ED Course  I have reviewed the triage vital signs and the nursing notes.  Pertinent labs & imaging results that were available during my care of the patient were reviewed by me and considered in my medical decision making (see chart for details).  21 year old male with bug bite and now multiple constitutional symptoms. Vitals are normal and he is well-appearing. I think he may be somewhat anxious but I cannot explain his reported symptoms so will treat for possible tick-borne illness with Doxy for 5 days. Return precautions were given.  Final Clinical Impressions(s) / ED Diagnoses   Final diagnoses:  Insect bite, initial encounter    New Prescriptions New Prescriptions   No medications on file   I personally performed the services described in this documentation, which was scribed in my presence. The recorded information has been reviewed and is accurate.    Bethel Born, PA-C 06/03/17 1513    Cathren Laine, MD 06/04/17 1257

## 2017-06-02 NOTE — ED Notes (Signed)
Patient came d/t a bite to his right side of his back.   Pt stated that he felt hot and cold yesterday.  He think he got bit on Saturday while they were having a family reunion in one of his family's backyard.  He is concerned because he was diagnosed with pericarditis last July 2017.

## 2018-01-27 ENCOUNTER — Encounter (HOSPITAL_BASED_OUTPATIENT_CLINIC_OR_DEPARTMENT_OTHER): Payer: Self-pay | Admitting: *Deleted

## 2018-01-27 ENCOUNTER — Other Ambulatory Visit: Payer: Self-pay

## 2018-01-27 ENCOUNTER — Emergency Department (HOSPITAL_BASED_OUTPATIENT_CLINIC_OR_DEPARTMENT_OTHER)
Admission: EM | Admit: 2018-01-27 | Discharge: 2018-01-28 | Disposition: A | Discharge status: Home / Self Care | Payer: Federal, State, Local not specified - PPO | Attending: Emergency Medicine | Admitting: Emergency Medicine

## 2018-01-27 ENCOUNTER — Emergency Department (HOSPITAL_BASED_OUTPATIENT_CLINIC_OR_DEPARTMENT_OTHER)
Admission: EM | Admit: 2018-01-27 | Discharge: 2018-01-27 | Disposition: A | Discharge status: Home / Self Care | Payer: Federal, State, Local not specified - PPO | Source: Home / Self Care

## 2018-01-27 DIAGNOSIS — R6884 Jaw pain: Secondary | ICD-10-CM | POA: Diagnosis present

## 2018-01-27 DIAGNOSIS — S20212A Contusion of left front wall of thorax, initial encounter: Secondary | ICD-10-CM | POA: Diagnosis not present

## 2018-01-27 DIAGNOSIS — Y939 Activity, unspecified: Secondary | ICD-10-CM | POA: Insufficient documentation

## 2018-01-27 DIAGNOSIS — J029 Acute pharyngitis, unspecified: Secondary | ICD-10-CM | POA: Diagnosis not present

## 2018-01-27 DIAGNOSIS — Y999 Unspecified external cause status: Secondary | ICD-10-CM | POA: Insufficient documentation

## 2018-01-27 DIAGNOSIS — Y929 Unspecified place or not applicable: Secondary | ICD-10-CM | POA: Insufficient documentation

## 2018-01-27 MED ORDER — KETOROLAC TROMETHAMINE 30 MG/ML IJ SOLN
30.0000 mg | Freq: Once | INTRAMUSCULAR | Status: AC
  Administered 2018-01-28: 30 mg via INTRAMUSCULAR
  Filled 2018-01-27: qty 1

## 2018-01-27 MED ORDER — HYDROCODONE-ACETAMINOPHEN 5-325 MG PO TABS
1.0000 | ORAL_TABLET | Freq: Once | ORAL | Status: AC
  Administered 2018-01-28: 1 via ORAL
  Filled 2018-01-27: qty 1
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
## 2018-01-28 ENCOUNTER — Emergency Department (HOSPITAL_BASED_OUTPATIENT_CLINIC_OR_DEPARTMENT_OTHER): Payer: Federal, State, Local not specified - PPO

## 2018-01-28 MED ORDER — NAPROXEN 500 MG PO TABS: 500.0000 mg | ORAL_TABLET | Freq: Two times a day (BID) | ORAL | 0 refills | Status: AC
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
## 2020-12-06 ENCOUNTER — Other Ambulatory Visit: Payer: Self-pay

## 2020-12-06 ENCOUNTER — Encounter (HOSPITAL_COMMUNITY): Payer: Self-pay | Admitting: Emergency Medicine

## 2020-12-06 ENCOUNTER — Ambulatory Visit (HOSPITAL_COMMUNITY)
Admission: EM | Admit: 2020-12-06 | Discharge: 2020-12-06 | Disposition: A | Discharge status: Home / Self Care | Payer: Federal, State, Local not specified - PPO | Attending: Medical Oncology | Admitting: Medical Oncology

## 2020-12-06 DIAGNOSIS — H1013 Acute atopic conjunctivitis, bilateral: Secondary | ICD-10-CM

## 2020-12-06 MED ORDER — CETIRIZINE HCL 0.24 % OP SOLN: 1.0000 "application " | Freq: Once | OPHTHALMIC | 0 refills | Status: AC | PRN

## 2020-12-06 NOTE — ED Triage Notes (Signed)
Pt presents with eye irritation xs 5 days. States eyes are burning, itching, redness.

## 2020-12-06 NOTE — Discharge Instructions (Addendum)
Cetirizine eye drops and allergist
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
## 2021-11-06 ENCOUNTER — Ambulatory Visit (HOSPITAL_COMMUNITY)
Admission: EM | Admit: 2021-11-06 | Discharge: 2021-11-06 | Disposition: A | Discharge status: Home / Self Care | Payer: Federal, State, Local not specified - PPO | Attending: Emergency Medicine | Admitting: Emergency Medicine

## 2021-11-06 ENCOUNTER — Encounter (HOSPITAL_COMMUNITY): Payer: Self-pay

## 2021-11-06 ENCOUNTER — Other Ambulatory Visit: Payer: Self-pay

## 2021-11-06 DIAGNOSIS — L03032 Cellulitis of left toe: Secondary | ICD-10-CM | POA: Diagnosis not present

## 2021-11-06 MED ORDER — CEPHALEXIN 500 MG PO CAPS: 500.0000 mg | ORAL_CAPSULE | Freq: Four times a day (QID) | ORAL | 0 refills | Status: AC

## 2021-11-06 NOTE — ED Provider Notes (Signed)
[facility]  ____________________________________________  Time seen: Approximately 6:26 PM  I have reviewed the triage vital signs and the nursing notes.   HISTORY  Chief Complaint Toe Pain (In grown toe nail )   Historian Patient     HPI Victor Dennis is a 25 y.o. male presents to the urgent care with concern for possible bilateral paronychias.  Patient states that he is prone to paronychias.  He states that symptoms seem to be resolving on their own but is concerned that he might need an antibiotic as he has a heart murmur.  No fever or chills.   Past Medical History:  Diagnosis Date   Bronchitis    Heart murmur    Pericarditis      Immunizations up to date:  Yes.     Past Medical History:  Diagnosis Date   Bronchitis    Heart murmur    Pericarditis     Patient Active Problem List   Diagnosis Date Noted   Sports physical 01/13/2013    History reviewed. No pertinent surgical history.  Prior to Admission medications   Medication Sig Start Date End Date Taking? Authorizing Provider  cephALEXin (KEFLEX) 500 MG capsule Take 1 capsule (500 mg total) by mouth 4 (four) times daily for 7 days. 11/06/21 11/13/21 Yes Pia Mau M, PA-C  cetirizine (ZYRTEC) 10 MG tablet Take 10 mg by mouth daily.    [provider]  Cetirizine HCl 0.24 % SOLN Apply 1 application to eye once as needed for up to 1 dose. 12/06/20   Rushie Chestnut, PA-C  COLCHICINE PO Take by mouth.    [provider]  doxycycline (VIBRAMYCIN) 100 MG capsule Take 1 capsule (100 mg total) by mouth 2 (two) times daily. 06/02/17   Bethel Born, PA-C  erythromycin ophthalmic ointment Place into the left eye 3 (three) times daily. after warm cloth soak to eye. 10/15/15   Linna Hoff, MD  fluticasone (FLONASE) 50 MCG/ACT nasal spray Place 1 spray into both nostrils 2 (two) times daily. 03/22/14   Linna Hoff, MD  Guaifenesin 1200 MG TB12 Take 1 tablet (1,200 mg  total) by mouth 2 (two) times daily. 01/24/13   Lawyer, Cristal Deer, PA-C  meloxicam (MOBIC) 15 MG tablet Take 1 tablet (15 mg total) by mouth daily. 02/13/16   Molpus, John, MD  naproxen (NAPROSYN) 500 MG tablet Take 1 tablet (500 mg total) by mouth 2 (two) times daily. 01/28/18   Horton, Mayer Masker, MD    Allergies Dust mite extract  Family History  Problem Relation Age of Onset   Sudden death Neg Hx    Heart attack Neg Hx     Social History Social History   Tobacco Use   Smoking status: Never   Smokeless tobacco: Never  Vaping Use   Vaping Use: Never used  Substance Use Topics   Alcohol use: No   Drug use: No     Review of Systems  Constitutional: No fever/chills Eyes:  No discharge ENT: No upper respiratory complaints. Respiratory: no cough. No SOB/ use of accessory muscles to breath Gastrointestinal:   No nausea, no vomiting.  No diarrhea.  No constipation. Musculoskeletal: Negative for musculoskeletal pain. Skin: Negative for rash, abrasions, lacerations, ecchymosis.   ____________________________________________   PHYSICAL EXAM:  VITAL SIGNS: ED Triage Vitals  Enc Vitals Group     BP 11/06/21 1751 129/75     Pulse Rate 11/06/21 1751 100     Resp 11/06/21 1751  18     Temp 11/06/21 1751 98.5 F (36.9 C)     Temp Source 11/06/21 1751 Oral     SpO2 11/06/21 1751 99 %     Weight --      Height --      Head Circumference --      Peak Flow --      Pain Score 11/06/21 1749 10     Pain Loc --      Pain Edu? --      Excl. in [place]? --      Constitutional: Alert and oriented. Well appearing and in no acute distress. Eyes: Conjunctivae are normal. PERRL. EOMI. Head: Atraumatic. ENT: Cardiovascular: Normal rate, regular rhythm. Normal S1 and S2.  Good peripheral circulation. Respiratory: Normal respiratory effort without tachypnea or retractions. Lungs CTAB. Good air entry to the bases with no decreased or absent breath sounds Gastrointestinal: Bowel sounds x  4 quadrants. Soft and nontender to palpation. No guarding or rigidity. No distention. Musculoskeletal: Full range of motion to all extremities. No obvious deformities noted Neurologic:  Normal for age. No gross focal neurologic deficits are appreciated.  Skin: Skin is not edematous or erythematous.  No expression of purulence. Psychiatric: Mood and affect are normal for age. Speech and behavior are normal.   ____________________________________________   LABS (all labs ordered are listed, but only abnormal results are displayed)  Labs Reviewed - No data to display ____________________________________________  EKG   ____________________________________________  RADIOLOGY   No results found.  ____________________________________________    PROCEDURES  Procedure(s) performed:     Procedures     Medications - No data to display   ____________________________________________   INITIAL IMPRESSION / ASSESSMENT AND PLAN / ED COURSE  Pertinent labs & imaging results that were available during my care of the patient were reviewed by me and considered in my medical decision making (see chart for details).      Assessment and plan Paronychia 25 year old male presents to the urgent care with concern for bilateral paronychias of the great toes.  Symptoms seem to be resolving on their own.  Recommend warm soapy soaks 3 times a day.  I did give patient prescription for Keflex but stated that he did not need to start medication unless patient developed erythema or expression of purulence.  He voiced understanding.     ____________________________________________  FINAL CLINICAL IMPRESSION(S) / ED DIAGNOSES  Final diagnoses:  Paronychia of fifth toe, left      NEW MEDICATIONS STARTED DURING THIS VISIT:  ED Discharge Orders          Ordered    cephALEXin (KEFLEX) 500 MG capsule  4 times daily        11/06/21 1816                This chart was  dictated using voice recognition software/Dragon. Despite best efforts to proofread, errors can occur which can change the meaning. Any change was purely unintentional.     Lannie Fields, PA-C 11/06/21 1827

## 2021-11-06 NOTE — ED Triage Notes (Signed)
Patient presents to Urgent Care with complaints of ingrown toe nail on bilateral big toes. He states he has developed pain, redness, and discharge 3 days ago. He has been cleaning toes with peroxide and alcohol.   Denies fever.

## 2021-11-06 NOTE — Discharge Instructions (Signed)
If your toes continue to look as well as they do now, you do not need to take antibiotic. Soak toes in warm soapy water and push skin away from fingernail 3 times a day for 5 minutes. You have been prescribed an antibiotic and you can start it if you notice redness or expression of purulence around toenail.
